# Patient Record
Sex: Female | Born: 1970 | Hispanic: No | Marital: Married | Smoking: Never smoker
Health system: Southern US, Community
[De-identification: ages and names within clinical notes are randomized; demographics above are authoritative.]

## PROBLEM LIST (undated history)

## (undated) DIAGNOSIS — D849 Immunodeficiency, unspecified: Secondary | ICD-10-CM

## (undated) HISTORY — PX: BACK SURGERY: SHX140

## (undated) HISTORY — PX: ABDOMINAL HYSTERECTOMY: SHX81

---

## 2003-01-10 ENCOUNTER — Encounter: Payer: Self-pay | Admitting: Chiropractor

## 2003-01-10 ENCOUNTER — Encounter: Admission: RE | Admit: 2003-01-10 | Discharge: 2003-01-10 | Payer: Self-pay | Admitting: Chiropractor

## 2003-12-30 ENCOUNTER — Other Ambulatory Visit: Admission: RE | Admit: 2003-12-30 | Discharge: 2003-12-30 | Payer: Self-pay | Admitting: Internal Medicine

## 2004-01-02 ENCOUNTER — Encounter: Admission: RE | Admit: 2004-01-02 | Discharge: 2004-01-02 | Payer: Self-pay | Admitting: Internal Medicine

## 2004-12-20 ENCOUNTER — Ambulatory Visit: Payer: Self-pay | Admitting: Internal Medicine

## 2005-01-06 ENCOUNTER — Encounter (INDEPENDENT_AMBULATORY_CARE_PROVIDER_SITE_OTHER): Payer: Self-pay | Admitting: *Deleted

## 2005-01-06 ENCOUNTER — Ambulatory Visit (HOSPITAL_COMMUNITY): Admission: RE | Admit: 2005-01-06 | Discharge: 2005-01-06 | Payer: Self-pay | Admitting: Obstetrics & Gynecology

## 2005-02-16 ENCOUNTER — Ambulatory Visit: Payer: Self-pay | Admitting: Internal Medicine

## 2005-02-16 ENCOUNTER — Encounter: Admission: RE | Admit: 2005-02-16 | Discharge: 2005-02-16 | Payer: Self-pay | Admitting: Internal Medicine

## 2005-02-18 ENCOUNTER — Encounter: Admission: RE | Admit: 2005-02-18 | Discharge: 2005-02-18 | Payer: Self-pay | Admitting: Internal Medicine

## 2005-02-23 ENCOUNTER — Encounter: Admission: RE | Admit: 2005-02-23 | Discharge: 2005-02-23 | Payer: Self-pay | Admitting: Neurology

## 2005-04-11 ENCOUNTER — Ambulatory Visit: Payer: Self-pay | Admitting: Internal Medicine

## 2005-05-10 ENCOUNTER — Encounter: Admission: RE | Admit: 2005-05-10 | Discharge: 2005-05-10 | Payer: Self-pay | Admitting: Obstetrics & Gynecology

## 2005-09-22 ENCOUNTER — Ambulatory Visit: Payer: Self-pay | Admitting: Family Medicine

## 2005-11-14 ENCOUNTER — Ambulatory Visit: Payer: Self-pay | Admitting: Internal Medicine

## 2005-12-02 ENCOUNTER — Emergency Department (HOSPITAL_COMMUNITY): Admission: EM | Admit: 2005-12-02 | Discharge: 2005-12-02 | Payer: Self-pay | Admitting: Emergency Medicine

## 2005-12-05 ENCOUNTER — Ambulatory Visit: Payer: Self-pay | Admitting: Internal Medicine

## 2005-12-13 ENCOUNTER — Encounter: Admission: RE | Admit: 2005-12-13 | Discharge: 2005-12-13 | Payer: Self-pay | Admitting: Internal Medicine

## 2005-12-26 ENCOUNTER — Ambulatory Visit: Payer: Self-pay | Admitting: Internal Medicine

## 2006-01-20 ENCOUNTER — Ambulatory Visit: Payer: Self-pay | Admitting: Internal Medicine

## 2006-02-20 ENCOUNTER — Ambulatory Visit: Payer: Self-pay | Admitting: Internal Medicine

## 2006-06-02 ENCOUNTER — Ambulatory Visit: Payer: Self-pay | Admitting: Internal Medicine

## 2006-06-29 ENCOUNTER — Ambulatory Visit: Payer: Self-pay | Admitting: Internal Medicine

## 2006-12-27 ENCOUNTER — Ambulatory Visit: Payer: Self-pay | Admitting: Internal Medicine

## 2007-03-01 ENCOUNTER — Ambulatory Visit: Payer: Self-pay | Admitting: Internal Medicine

## 2007-03-01 ENCOUNTER — Encounter: Payer: Self-pay | Admitting: Internal Medicine

## 2007-03-01 DIAGNOSIS — M542 Cervicalgia: Secondary | ICD-10-CM

## 2007-03-01 DIAGNOSIS — G43909 Migraine, unspecified, not intractable, without status migrainosus: Secondary | ICD-10-CM | POA: Insufficient documentation

## 2007-03-01 DIAGNOSIS — M549 Dorsalgia, unspecified: Secondary | ICD-10-CM | POA: Insufficient documentation

## 2007-09-11 ENCOUNTER — Ambulatory Visit: Payer: Self-pay | Admitting: Internal Medicine

## 2007-11-13 ENCOUNTER — Ambulatory Visit: Payer: Self-pay | Admitting: Internal Medicine

## 2007-11-15 ENCOUNTER — Telehealth: Payer: Self-pay | Admitting: Internal Medicine

## 2007-11-16 ENCOUNTER — Telehealth (INDEPENDENT_AMBULATORY_CARE_PROVIDER_SITE_OTHER): Payer: Self-pay | Admitting: *Deleted

## 2007-12-31 ENCOUNTER — Ambulatory Visit: Payer: Self-pay | Admitting: Internal Medicine

## 2007-12-31 LAB — CONVERTED CEMR LAB
Beta hcg, urine, semiquantitative: NEGATIVE
Glucose, Bld: 111 mg/dL
Hemoglobin: 13.7 g/dL
Ketones, urine, test strip: NEGATIVE
Specific Gravity, Urine: 1.015
WBC Urine, dipstick: NEGATIVE
pH: 6

## 2008-05-06 ENCOUNTER — Telehealth (INDEPENDENT_AMBULATORY_CARE_PROVIDER_SITE_OTHER): Payer: Self-pay | Admitting: *Deleted

## 2008-05-07 ENCOUNTER — Ambulatory Visit: Payer: Self-pay | Admitting: Internal Medicine

## 2008-06-03 ENCOUNTER — Encounter: Payer: Self-pay | Admitting: Internal Medicine

## 2008-06-06 ENCOUNTER — Encounter: Payer: Self-pay | Admitting: Internal Medicine

## 2008-06-30 ENCOUNTER — Encounter: Payer: Self-pay | Admitting: Internal Medicine

## 2008-09-19 ENCOUNTER — Telehealth (INDEPENDENT_AMBULATORY_CARE_PROVIDER_SITE_OTHER): Payer: Self-pay | Admitting: *Deleted

## 2008-12-10 ENCOUNTER — Ambulatory Visit: Payer: Self-pay | Admitting: Internal Medicine

## 2008-12-10 LAB — CONVERTED CEMR LAB
Glucose, Urine, Semiquant: NEGATIVE
RBC / HPF: NONE SEEN (ref <3)
Specific Gravity, Urine: 1.015
Urobilinogen, UA: 0.2

## 2008-12-11 ENCOUNTER — Encounter: Payer: Self-pay | Admitting: Internal Medicine

## 2008-12-16 ENCOUNTER — Encounter: Payer: Self-pay | Admitting: Internal Medicine

## 2008-12-16 ENCOUNTER — Telehealth (INDEPENDENT_AMBULATORY_CARE_PROVIDER_SITE_OTHER): Payer: Self-pay | Admitting: *Deleted

## 2009-01-07 ENCOUNTER — Telehealth: Payer: Self-pay | Admitting: Internal Medicine

## 2009-01-29 ENCOUNTER — Encounter: Payer: Self-pay | Admitting: Internal Medicine

## 2009-05-04 ENCOUNTER — Ambulatory Visit: Payer: Self-pay | Admitting: Internal Medicine

## 2009-09-08 ENCOUNTER — Ambulatory Visit: Payer: Self-pay | Admitting: Internal Medicine

## 2009-09-08 LAB — CONVERTED CEMR LAB
Glucose, Urine, Semiquant: NEGATIVE
Nitrite: NEGATIVE

## 2009-09-09 ENCOUNTER — Telehealth: Payer: Self-pay | Admitting: Internal Medicine

## 2009-09-10 ENCOUNTER — Telehealth (INDEPENDENT_AMBULATORY_CARE_PROVIDER_SITE_OTHER): Payer: Self-pay | Admitting: *Deleted

## 2009-09-10 ENCOUNTER — Encounter: Payer: Self-pay | Admitting: Internal Medicine

## 2009-09-10 LAB — CONVERTED CEMR LAB: RBC / HPF: NONE SEEN (ref <3)

## 2009-09-11 ENCOUNTER — Encounter: Payer: Self-pay | Admitting: Internal Medicine

## 2009-09-30 ENCOUNTER — Telehealth (INDEPENDENT_AMBULATORY_CARE_PROVIDER_SITE_OTHER): Payer: Self-pay | Admitting: *Deleted

## 2009-10-01 ENCOUNTER — Telehealth (INDEPENDENT_AMBULATORY_CARE_PROVIDER_SITE_OTHER): Payer: Self-pay | Admitting: *Deleted

## 2009-10-16 ENCOUNTER — Ambulatory Visit: Payer: Self-pay | Admitting: Internal Medicine

## 2009-11-14 ENCOUNTER — Telehealth: Payer: Self-pay | Admitting: Internal Medicine

## 2009-11-18 ENCOUNTER — Encounter: Payer: Self-pay | Admitting: Internal Medicine

## 2009-11-24 ENCOUNTER — Ambulatory Visit: Payer: Self-pay | Admitting: Internal Medicine

## 2009-11-24 DIAGNOSIS — D1803 Hemangioma of intra-abdominal structures: Secondary | ICD-10-CM | POA: Insufficient documentation

## 2010-02-16 ENCOUNTER — Telehealth: Payer: Self-pay | Admitting: Internal Medicine

## 2010-02-16 ENCOUNTER — Ambulatory Visit: Payer: Self-pay | Admitting: Internal Medicine

## 2010-02-16 LAB — CONVERTED CEMR LAB
Bilirubin Urine: NEGATIVE
Blood in Urine, dipstick: NEGATIVE
Crystals: NONE SEEN
Glucose, Urine, Semiquant: NEGATIVE
Protein, U semiquant: NEGATIVE
RBC / HPF: NONE SEEN (ref <3)
pH: 5

## 2010-02-17 ENCOUNTER — Encounter: Payer: Self-pay | Admitting: Internal Medicine

## 2010-04-28 ENCOUNTER — Encounter (INDEPENDENT_AMBULATORY_CARE_PROVIDER_SITE_OTHER): Payer: Self-pay | Admitting: *Deleted

## 2010-05-03 ENCOUNTER — Ambulatory Visit: Payer: Self-pay | Admitting: Internal Medicine

## 2010-05-06 ENCOUNTER — Telehealth (INDEPENDENT_AMBULATORY_CARE_PROVIDER_SITE_OTHER): Payer: Self-pay | Admitting: *Deleted

## 2010-06-14 ENCOUNTER — Encounter: Payer: Self-pay | Admitting: Internal Medicine

## 2010-06-29 ENCOUNTER — Telehealth (INDEPENDENT_AMBULATORY_CARE_PROVIDER_SITE_OTHER): Payer: Self-pay | Admitting: *Deleted

## 2010-08-31 ENCOUNTER — Ambulatory Visit: Payer: Self-pay | Admitting: Family Medicine

## 2010-08-31 LAB — CONVERTED CEMR LAB
Bilirubin Urine: NEGATIVE
Blood in Urine, dipstick: NEGATIVE
Nitrite: NEGATIVE
Specific Gravity, Urine: 1.01
pH: 5

## 2010-09-01 ENCOUNTER — Encounter: Payer: Self-pay | Admitting: Family Medicine

## 2010-11-10 ENCOUNTER — Ambulatory Visit: Payer: Self-pay | Admitting: Internal Medicine

## 2010-12-19 ENCOUNTER — Encounter: Payer: Self-pay | Admitting: Internal Medicine
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
## 2010-12-28 NOTE — Progress Notes (Signed)
Summary: eye very red/// SEEN 05/03/10  Phone Note Call from Patient Call back at Work Phone [phone] Call back at [phone]   Caller: Patient Summary of Call: patient said she just looked in the mirror & her eye is bright red & painful Initial call taken by: Okey Regal Spring,  May 06, 2010 4:20 PM  Follow-up for Phone Call        Spoke with pt   seen 05/03/10; who says  right eye very red  and hurts when blinking eye. she is taking amoxicillin OV sched for am, recommend pt to [facility] tonite if signs severe, pt agreed. Kandice Hams  May 06, 2010 4:31 PM  Follow-up by: Kandice Hams,  May 06, 2010 4:24 PM  Additional Follow-up for Phone Call Additional follow up Details #1::        please advise the patient to see her optometrist or ophthalmologist tomorrow. If she needs help with appointment then arrange. Jose E. Paz MD  May 06, 2010 4:48 PM     Additional Follow-up for Phone Call Additional follow up Details #2::    spoke with pt agreed, she will see her eye doc tomorrow .Kandice Hams  May 06, 2010 5:06 PM    Appended Document: eye very red/// SEEN 05/03/10 Patient called and wanted to let you know she went and say Dr. Hazle Quant this morning and he diagnosed her with iritis  Appended Document: eye very red/// SEEN 05/03/10 spoke w/ patient , on steroids, under care of a ophtalmologist specialist. Still in pain

## 2010-12-28 NOTE — Letter (Signed)
Summary: iritis---Rheumatology & Clinical Immunology  [facility] Rheumatology & Clinical Immunology   Imported By: Lanelle Bal 06/30/2010 14:07:50  _____________________________________________________________________  External Attachment:    Type:   Image     Comment:   External Document

## 2010-12-28 NOTE — Miscellaneous (Signed)
----   Converted from flag ---- ---- 04/15/2010 10:39 AM, Shary Decamp wrote: ---- 11/24/2009 5:52 PM, Jose E. Paz MD wrote: needs follow-up liver  ultrasound at [facility] ------------------------------

## 2010-12-28 NOTE — Progress Notes (Signed)
Summary: uti sx  Phone Note Call from Patient   Caller: Patient- walked  Summary of Call: patient walked in is sure she has a uti and would like to leave urine sample doesn't want to be seen .........Marland KitchenDoristine Devoid CMA  June 29, 2010 3:35 PM   Follow-up for Phone Call        if she left a sample, do a UA and a urine culture. She didn't, tell  her she can came in  the morning and leave a sample Follow-up by: Jose E. Paz MD,  June 29, 2010 5:02 PM  Additional Follow-up for Phone Call Additional follow up Details #1::        called pt and she states that she is waiting to here back from her other Dr. because they did a urine sample 2 weeks ago and she is waiting to see if something shows in that sample. If not she will come in sometime today to leave a sample.Karoline Caldwell Negrete  June 30, 2010 8:42 AM

## 2010-12-28 NOTE — Progress Notes (Signed)
  Phone Note Call from Patient Call back at [phone]   Summary of Call: Patient has UTI sxs: dark urine +odor dysuria low abd discomfort Patient is working until 5 today & tomorrow Advised pt to come in today to drop of urine for UA, cx Do you want to treat with results or go ahead & start her on abx? Shary Decamp  February 16, 2010 8:56 AM   Follow-up for Phone Call        will treat based on the results Follow-up by: [facility] E. Brandley Aldrete MD,  February 16, 2010 1:09 PM

## 2010-12-28 NOTE — Letter (Signed)
Summary: labs, XR neg---- Rheumatology & Clinical Immunology  [facility] Rheumatology & Clinical Immunology   Imported By: Lanelle Bal 07/08/2010 10:43:11  _____________________________________________________________________  External Attachment:    Type:   Image     Comment:   External Document
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
## 2011-01-11 ENCOUNTER — Ambulatory Visit (INDEPENDENT_AMBULATORY_CARE_PROVIDER_SITE_OTHER): Payer: PRIVATE HEALTH INSURANCE | Admitting: Internal Medicine

## 2011-01-11 ENCOUNTER — Encounter: Payer: Self-pay | Admitting: Internal Medicine

## 2011-01-11 DIAGNOSIS — R109 Unspecified abdominal pain: Secondary | ICD-10-CM

## 2011-01-11 DIAGNOSIS — M549 Dorsalgia, unspecified: Secondary | ICD-10-CM

## 2011-01-11 LAB — CONVERTED CEMR LAB
Blood in Urine, dipstick: NEGATIVE
Casts: NONE SEEN /lpf
Crystals: NONE SEEN
Glucose, Urine, Semiquant: NEGATIVE
Protein, U semiquant: NEGATIVE
Specific Gravity, Urine: 1.015
WBC Urine, dipstick: NEGATIVE
pH: 6.5

## 2011-01-12 ENCOUNTER — Encounter: Payer: Self-pay | Admitting: Internal Medicine
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
## 2011-04-15 NOTE — Op Note (Signed)
Katelyn Goodwin, NEAL NO.:  [id number]   MEDICAL RECORD NO.:  [id number]          PATIENT TYPE:  AMB   LOCATION:  SDC                           FACILITY:  [facility]   PHYSICIAN:  Freddy Finner, M.D.   DATE OF BIRTH:  [birth year]   DATE OF PROCEDURE:  01/06/2005  DATE OF DISCHARGE:                                 OPERATIVE REPORT   PREOPERATIVE DIAGNOSES:  1.  Chronic pelvic pain.  2.  Menorrhagia.  3.  Multiparity.  4.  Dysmenorrhea.   POSTOPERATIVE DIAGNOSES:  1.  Chronic pelvic pain.  2.  Menorrhagia.  3.  Multiparity.  4.  Dysmenorrhea.  5.  Overall size of uterus slightly enlarged but essentially symmetrical.  6.  Normal-appearing endometrial cavity.  7.  Filmy adhesion of left fallopian tube to left pelvic sidewall, not      occlusive.   OPERATIVE PROCEDURE:  Laparoscopy, bipolar coagulation and division of  fallopian tubes, uterosacral nerve ablation, followed by hysteroscopy D&C,  NovaSure endometrial ablation.   ESTIMATED INTRAOPERATIVE BLOOD LOSS:  Less than 10 mL.   INTRAOPERATIVE COMPLICATIONS:  None.   LACTATED RINGER'S DEFICIT:  40 mL.   The patient is a 40 year old with chronic __________ in the office.  The  posterior endometrial surface appeared to be a little irregular at that  exam.  She is admitted now for diagnostic laparoscopy, hysteroscopy, and  NovaSure ablation.  Also for tubal sterilization.   Operative findings are recorded in still photographs retained in the office  record.  Findings are essentially as noted above.   The patient was admitted on the morning of surgery, brought to the operating  room, placed under adequate general anesthesia, placed in the dorsal  lithotomy position using the [facility] stirrup system.  Betadine prep of the  abdomen, perineum, and vagina was carried out in the usual fashion.  Sterile  drapes were applied.  The cervix was visualized with a bivalve speculum.  Hulka tenaculum was attached to the  cervix without difficulty.  The bladder  was evacuated with a Robinson catheter.  Two small incisions were made in  the abdomen - one at the umbilicus, one just above the symphysis.  Through  the upper incision an 11 mm nonbladed disposable trocar was introduced while  elevating the anterior abdominal wall manually.  Direct inspection revealed  adequate placement with no evidence of injury on entry.  Pneumoperitoneum  was allowed to accumulate with carbon dioxide gas.  A second trocar was  placed under direct visualization through the smaller lower midline  incision.  Through it, a blunt probe was used for traction during the  procedure.  Systematic examination of pelvic and abdominal contents was  carried out with findings as noted above.  Bipolar forceps were introduced  in the operating channel of the laparoscope and the isthmic portion of each  fallopian tube fulgurated.  Uterosacrals were __________ and fulgurated at  their junction with the cervix.  Cauterized tubal segments were divided  through the cautery area on the tube.  Hemostasis was complete.  Gas was  allowed to escape from the  abdomen, the instruments were removed.  Skin  incisions were closed with interrupted subcuticular sutures of 3-0 Dexon.  Steri-Strips were applied to the lower incision.  Plain Marcaine 0.25% was  injected into the incision sites for postoperative analgesia.   Attention was then turned vaginally.  Bivalve speculum was reintroduced.  The cervix was grasped on the anterior cervical lip with a single tooth  tenaculum.  Paracervical block was placed with injections of 1% plain  Xylocaine at 4 and 8 o'clock in the vaginal fornices.  The uterus sounded to  9 cm.  The cervix sounded to 3.5 cm.  The cervix was progressively dilated  to 23 with Hegar dilators.  Direct inspection with a 12.5-degree  hysteroscope was then accomplished using lactated Ringer's as the distending  medium.  No intracavitary  abnormalities were identified.  Gentle thorough  curettage was carried out, sampling the endometrium thoroughly.  The  NovaSure device was then applied in standard fashion.  The cervix did  require additional dilatation to allow passage of the NovaSure.  Cavity  width was 4 cm.  Carbon dioxide test was negative.  Coagulation of the  endometrium was carried out with a total power of 121 watts and 70 seconds  of coagulation time.  The instrument was removed.  Reinspection of the  cavity confirmed adequate ablation throughout the endometrial cavity.  All  instruments were removed.  The patient was awakened and taken to recovery in  good condition.  She has Vicodin to be taken as needed for postoperative  pain.  She is to return to the office in approximately 2 weeks.  She was  given routine outpatient surgical instructions.      WRN/MEDQ  D:  01/06/2005  T:  01/06/2005  Job:  [id number]

## 2011-04-15 NOTE — Assessment & Plan Note (Signed)
[facility]                                 ON-CALL NOTE   Katelyn Goodwin, Katelyn Goodwin                    MRN:          [id number]  DATE:02/03/2007                            DOB:          1970-12-30    Call from [phone].  She called at 1:42 p.m. on February 03, 2007  complaining of back pain that has eased off from heat, and she is just  calling to see if it can wait until Monday.  She says she has some  pressure with urination, but no pain and no fevers.  I explained it  would be better that she get checked.  However, if she feels like the  pain is getting better, she could wait.  The patient prefers not to go  to Urgent Care today.  She will drink lots of water and cranberry juice,  and go to the emergency room or Urgent Care if symptoms do not improve.  Otherwise she will follow up with Dr. Drue Novel next week.     Lelon Perla, DO  Electronically Signed    Shawnie Dapper  DD: 02/03/2007  DT: 02/03/2007  Job #: [id number]   cc:   Willow Ora, MD

## 2015-05-12 ENCOUNTER — Emergency Department: Payer: No Typology Code available for payment source

## 2015-05-12 ENCOUNTER — Encounter: Payer: Self-pay | Admitting: Emergency Medicine

## 2015-05-12 ENCOUNTER — Emergency Department
Admission: EM | Admit: 2015-05-12 | Discharge: 2015-05-12 | Disposition: A | Discharge status: Home / Self Care | Payer: No Typology Code available for payment source | Attending: Emergency Medicine | Admitting: Emergency Medicine

## 2015-05-12 DIAGNOSIS — Y998 Other external cause status: Secondary | ICD-10-CM | POA: Diagnosis not present

## 2015-05-12 DIAGNOSIS — S76011A Strain of muscle, fascia and tendon of right hip, initial encounter: Secondary | ICD-10-CM

## 2015-05-12 DIAGNOSIS — M542 Cervicalgia: Secondary | ICD-10-CM | POA: Diagnosis not present

## 2015-05-12 DIAGNOSIS — S39012A Strain of muscle, fascia and tendon of lower back, initial encounter: Secondary | ICD-10-CM | POA: Insufficient documentation

## 2015-05-12 DIAGNOSIS — Y92410 Unspecified street and highway as the place of occurrence of the external cause: Secondary | ICD-10-CM | POA: Insufficient documentation

## 2015-05-12 DIAGNOSIS — R52 Pain, unspecified: Secondary | ICD-10-CM

## 2015-05-12 DIAGNOSIS — H5711 Ocular pain, right eye: Secondary | ICD-10-CM

## 2015-05-12 DIAGNOSIS — S0591XA Unspecified injury of right eye and orbit, initial encounter: Secondary | ICD-10-CM | POA: Diagnosis not present

## 2015-05-12 DIAGNOSIS — Y9389 Activity, other specified: Secondary | ICD-10-CM | POA: Insufficient documentation

## 2015-05-12 DIAGNOSIS — S3992XA Unspecified injury of lower back, initial encounter: Secondary | ICD-10-CM | POA: Diagnosis present

## 2015-05-12 HISTORY — DX: Immunodeficiency, unspecified: D84.9

## 2015-05-12 MED ORDER — HYDROMORPHONE HCL 1 MG/ML IJ SOLN
1.0000 mg | Freq: Once | INTRAMUSCULAR | Status: AC
  Administered 2015-05-12: 1 mg via INTRAMUSCULAR

## 2015-05-12 MED ORDER — KETOROLAC TROMETHAMINE 60 MG/2ML IM SOLN: 60.0000 mg | Freq: Once | INTRAMUSCULAR | Status: DC

## 2015-05-12 MED ORDER — ORPHENADRINE CITRATE 30 MG/ML IJ SOLN
60.0000 mg | Freq: Two times a day (BID) | INTRAMUSCULAR | Status: DC
  Administered 2015-05-12: 60 mg via INTRAMUSCULAR

## 2015-05-12 MED ORDER — OXYCODONE-ACETAMINOPHEN 7.5-325 MG PO TABS: 1.0000 | ORAL_TABLET | Freq: Four times a day (QID) | ORAL | Status: AC | PRN

## 2015-05-12 MED ORDER — KETOROLAC TROMETHAMINE 60 MG/2ML IM SOLN
60.0000 mg | Freq: Once | INTRAMUSCULAR | Status: AC
  Administered 2015-05-12: 60 mg via INTRAMUSCULAR

## 2015-05-12 MED ORDER — HYDROMORPHONE HCL 1 MG/ML IJ SOLN
INTRAMUSCULAR | Status: AC
  Administered 2015-05-12: 1 mg via INTRAMUSCULAR
  Filled 2015-05-12: qty 1

## 2015-05-12 MED ORDER — METHOCARBAMOL 750 MG PO TABS: 1500.0000 mg | ORAL_TABLET | Freq: Four times a day (QID) | ORAL | Status: AC

## 2015-05-12 MED ORDER — ORPHENADRINE CITRATE 30 MG/ML IJ SOLN
INTRAMUSCULAR | Status: AC
Start: 2015-05-12 — End: 2015-05-12
  Administered 2015-05-12: 60 mg via INTRAMUSCULAR
  Filled 2015-05-12: qty 2

## 2015-05-12 MED ORDER — KETOROLAC TROMETHAMINE 60 MG/2ML IM SOLN
INTRAMUSCULAR | Status: AC
  Filled 2015-05-12: qty 2

## 2015-05-12 MED ORDER — IBUPROFEN 800 MG PO TABS: 800.0000 mg | ORAL_TABLET | Freq: Three times a day (TID) | ORAL | Status: AC | PRN
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
## 2015-05-12 NOTE — Discharge Instructions (Signed)
Use eye drops as needed and follow up with Eye Doctor.
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
